# Patient Record
Sex: Female | Born: 1975 | Race: White | Hispanic: No | Marital: Married | Smoking: Never smoker
Health system: Southern US, Community
[De-identification: ages and names within clinical notes are randomized; demographics above are authoritative.]

---

## 2014-09-30 ENCOUNTER — Ambulatory Visit (INDEPENDENT_AMBULATORY_CARE_PROVIDER_SITE_OTHER): Payer: Managed Care, Other (non HMO) | Admitting: Family Medicine

## 2014-09-30 ENCOUNTER — Ambulatory Visit (INDEPENDENT_AMBULATORY_CARE_PROVIDER_SITE_OTHER): Payer: Managed Care, Other (non HMO)

## 2014-09-30 VITALS — BP 96/68 | HR 68 | Temp 97.4°F | Resp 17 | Ht 60.0 in | Wt 98.0 lb

## 2014-09-30 DIAGNOSIS — M25572 Pain in left ankle and joints of left foot: Secondary | ICD-10-CM

## 2014-09-30 DIAGNOSIS — S82892A Other fracture of left lower leg, initial encounter for closed fracture: Secondary | ICD-10-CM | POA: Diagnosis not present

## 2014-09-30 DIAGNOSIS — T148XXA Other injury of unspecified body region, initial encounter: Secondary | ICD-10-CM

## 2014-09-30 MED ORDER — HYDROCODONE-ACETAMINOPHEN 5-325 MG PO TABS: 1.0000 | ORAL_TABLET | Freq: Four times a day (QID) | ORAL | Status: AC | PRN

## 2014-09-30 MED ORDER — IBUPROFEN 600 MG PO TABS: 600.0000 mg | ORAL_TABLET | Freq: Three times a day (TID) | ORAL | Status: AC | PRN

## 2014-09-30 MED ORDER — CYCLOBENZAPRINE HCL 5 MG PO TABS: 5.0000 mg | ORAL_TABLET | Freq: Three times a day (TID) | ORAL | Status: AC | PRN

## 2014-09-30 NOTE — Progress Notes (Signed)
s     Chief Complaint:  Chief Complaint  Patient presents with  . Ankle Injury    left side     HPI: Melissa Ponce is a 39 y.o. female who is here for  left ankle and foot pain after running and tripped over a piece of asphalt on the road. She has some numbness of the left toe. She has no history of stress fractures, ankle sprains or foot fractures. She has iced it. It is painful to bear any weight on it. She has severe pain. It is sharp.  No past medical history on file. No past surgical history on file. History   Social History  . Marital Status: Married    Spouse Name: N/A  . Number of Children: N/A  . Years of Education: N/A   Social History Main Topics  . Smoking status: Never Smoker   . Smokeless tobacco: Not on file  . Alcohol Use: Not on file  . Drug Use: Not on file  . Sexual Activity: Not on file   Other Topics Concern  . None   Social History Narrative  . None   No family history on file. No Known Allergies Prior to Admission medications   Not on File     ROS: The patient denies fevers, chills, night sweats, unintentional weight loss, chest pain, palpitations, wheezing, dyspnea on exertion, nausea, vomiting, abdominal pain, dysuria, hematuria, melena, , weakness, or tingling.  All other systems have been reviewed and were otherwise negative with the exception of those mentioned in the HPI and as above.    PHYSICAL EXAM: Filed Vitals:   09/30/14 1046  BP: 96/68  Pulse: 68  Temp: 97.4 F (36.3 C)  Resp: 17   Filed Vitals:   09/30/14 1046  Height: 5' (1.524 m)  Weight: 98 lb (44.453 kg)   Body mass index is 19.14 kg/(m^2).   General: Alert, tearful, moderate acute distress HEENT:  Normocephalic, atraumatic, oropharynx patent. EOMI, PERRLA Cardiovascular:  Regular rate and rhythm, no rubs murmurs or gallops.  No Carotid bruits, radial pulse intact. No pedal edema.  Respiratory: Clear to auscultation bilaterally.  No wheezes, rales,  or rhonchi.  No cyanosis, no use of accessory musculature GI: No organomegaly, abdomen is soft and non-tender, positive bowel sounds.  No masses. Skin: No rashes. Neurologic: Facial musculature symmetric. Psychiatric: Patient is appropriate throughout our interaction. Lymphatic: No cervical lymphadenopathy Musculoskeletal: Gait intact.   LABS: No results found for this or any previous visit.   EKG/XRAY:   Primary read [facility]. Distal minimally displaced tibia fracture vs less likely artifact   ASSESSMENT/PLAN: Encounter Diagnoses  Name Primary?  . Pain in joint, ankle and foot, left Yes  . Sprain and strain   . Ankle fracture, left     IMPRESSION: 1. Possible nondisplaced fracture at the posterolateral distal tibia seen only on the oblique view. Correlate for point tenderness which would corroborate a fracture. 2. No other acute fracture or dislocation identified about the left ankle.   Electronically Signed  By: Odessa FlemingH Hall M.D.  On: 09/30/2014 13:11  Patient was given a cam boot and crutches. She is also given her x-rays and official x-ray report was given to her, she is from [place] so will get rechecked in [place] with an orthopedist [place]. Prescriptions for hydrocodone, Flexeril, prescription strength ibuprofen were given Follow up when necessary Gross sideeffects, risk and benefits, and alternatives of medications  d/w patient. Patient is aware that all medications have potential sideeffects and we are unable to predict every sideeffect or drug-drug interaction that may occur.  Lillyth Spong PHUONG, DO 09/30/2014 1:04 PM

## 2016-02-25 IMAGING — CR DG FOOT 2V*L*
3 series · 3 of 3 positions shown · non-contrast
Comparison: Left ankle series from today reported separately.

CLINICAL DATA: 39-year-old female with twisting injury while
running. Foot and ankle pain. Initial encounter.

EXAM:
LEFT FOOT - 2 VIEW

[AP]
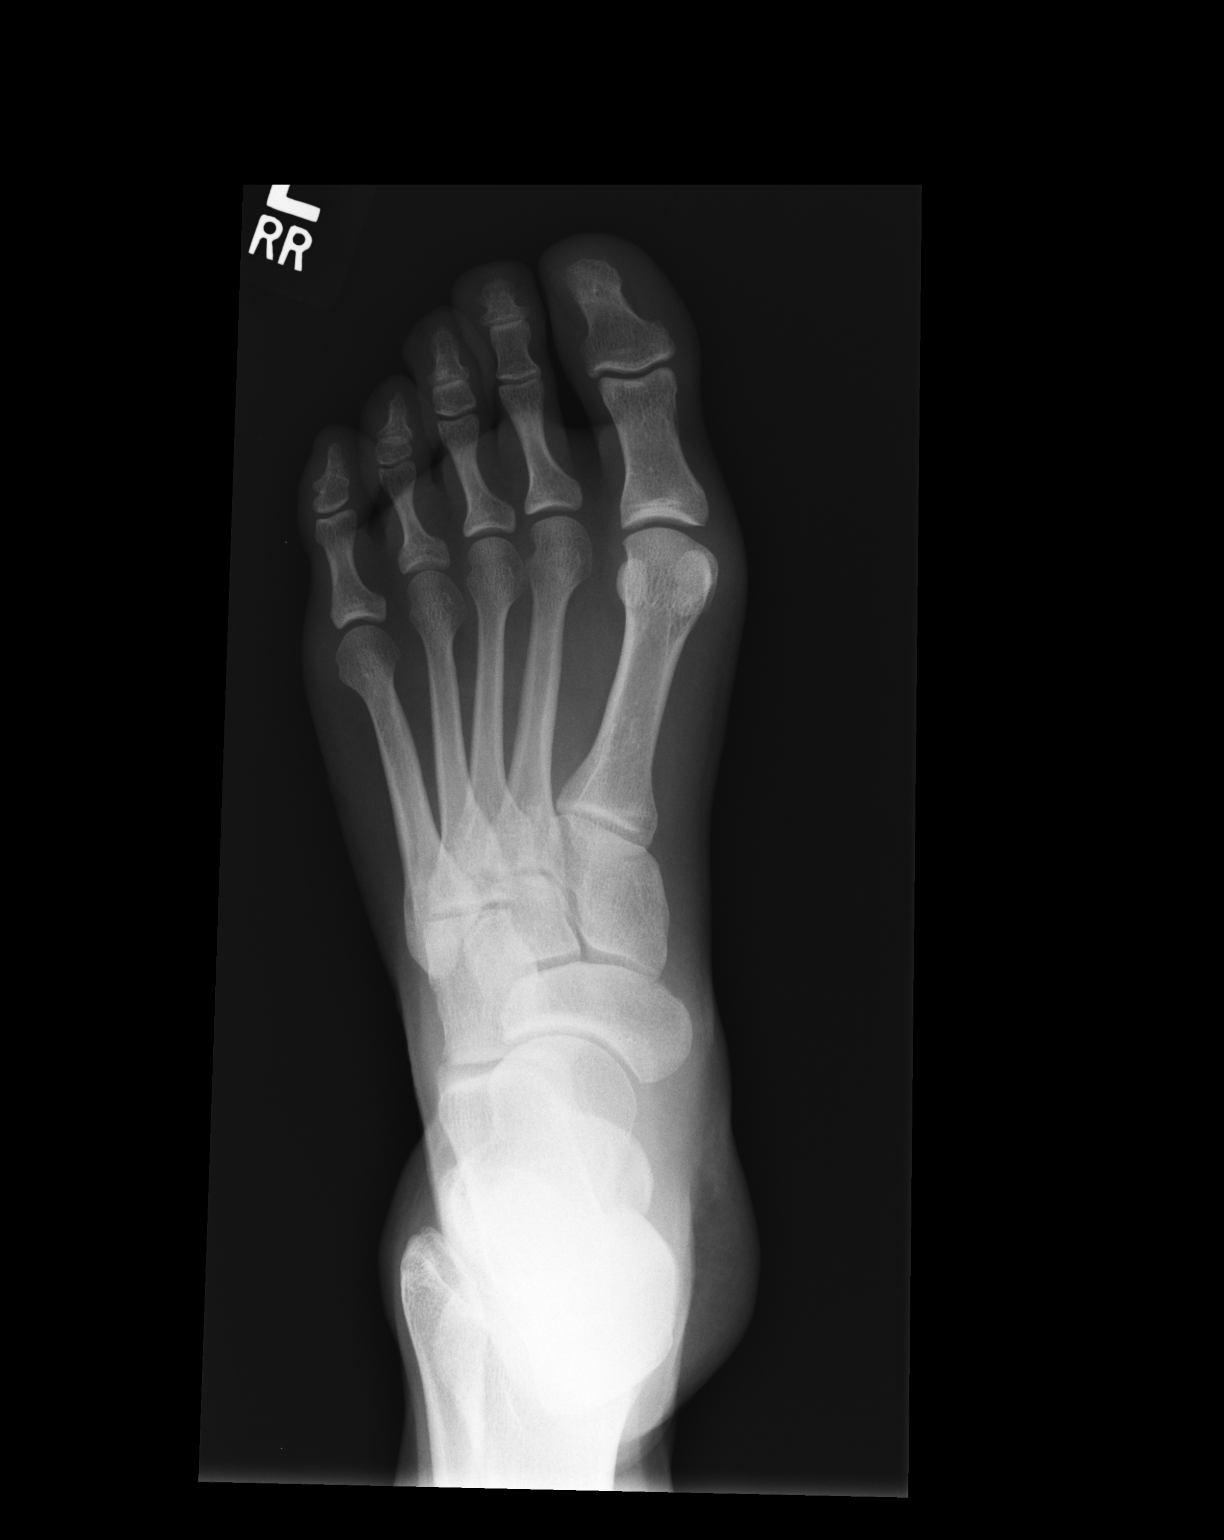

[ap obl int rot]
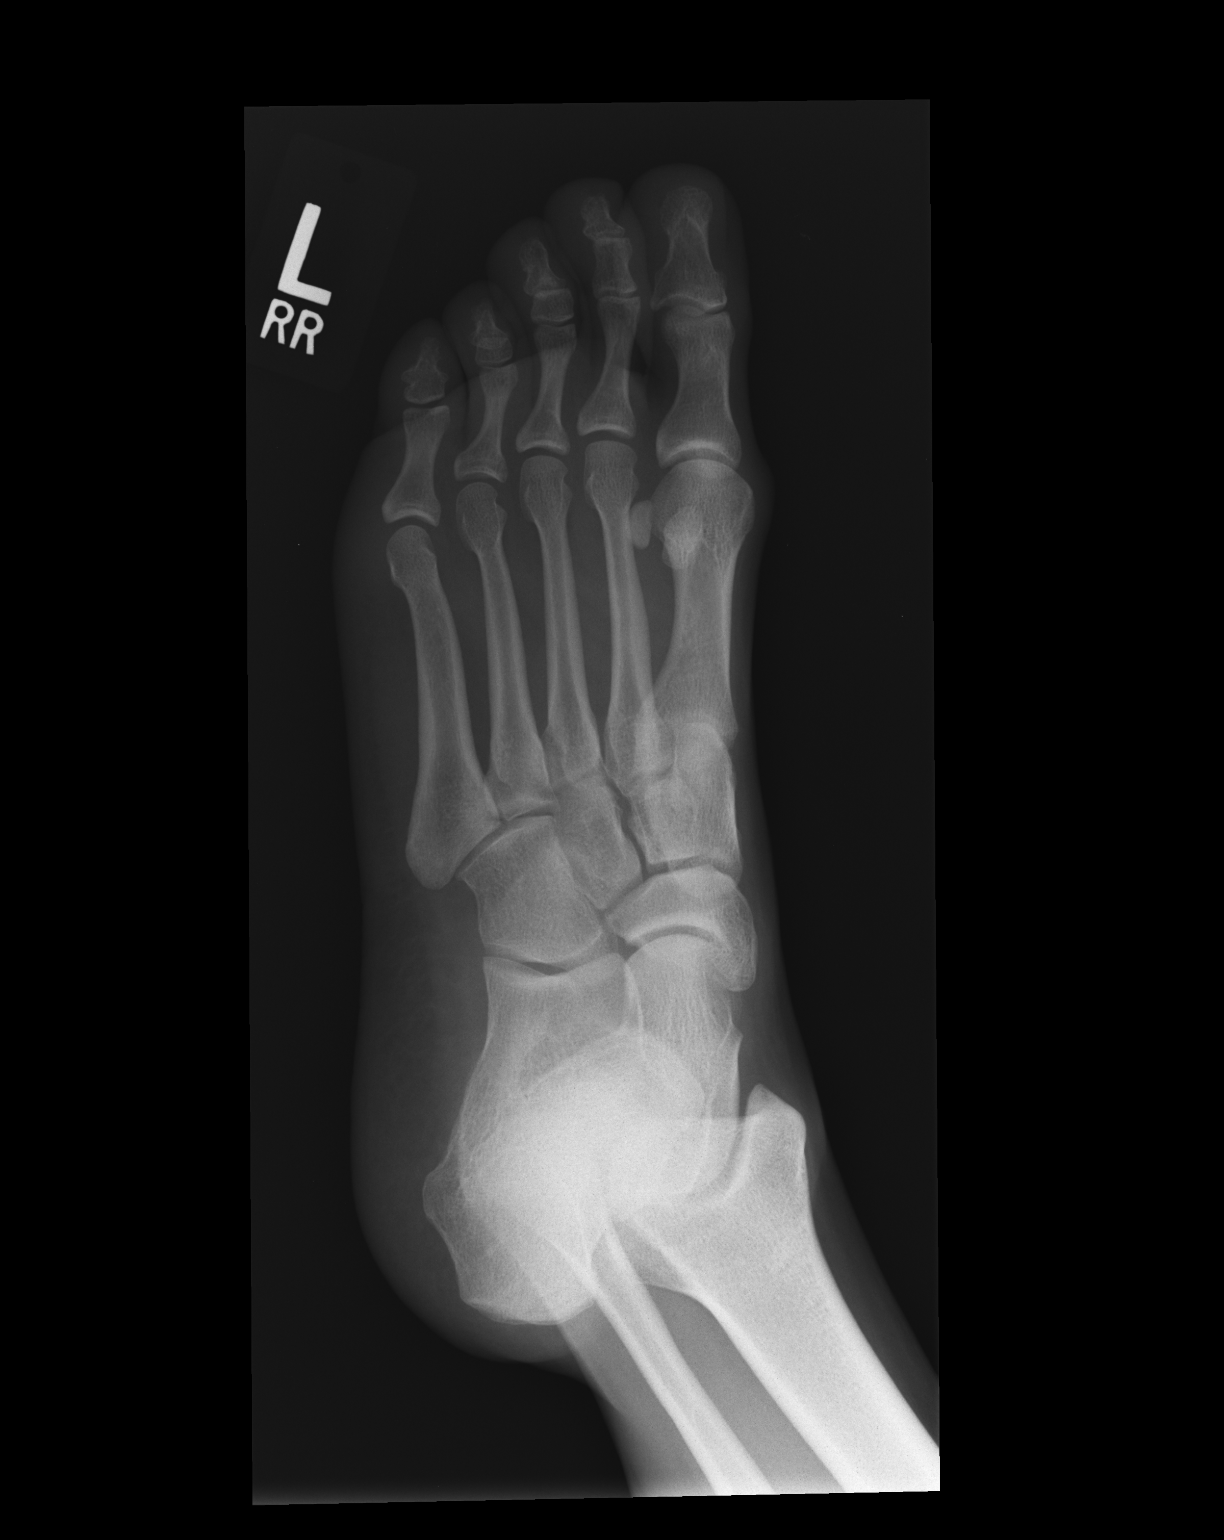

[lateral]
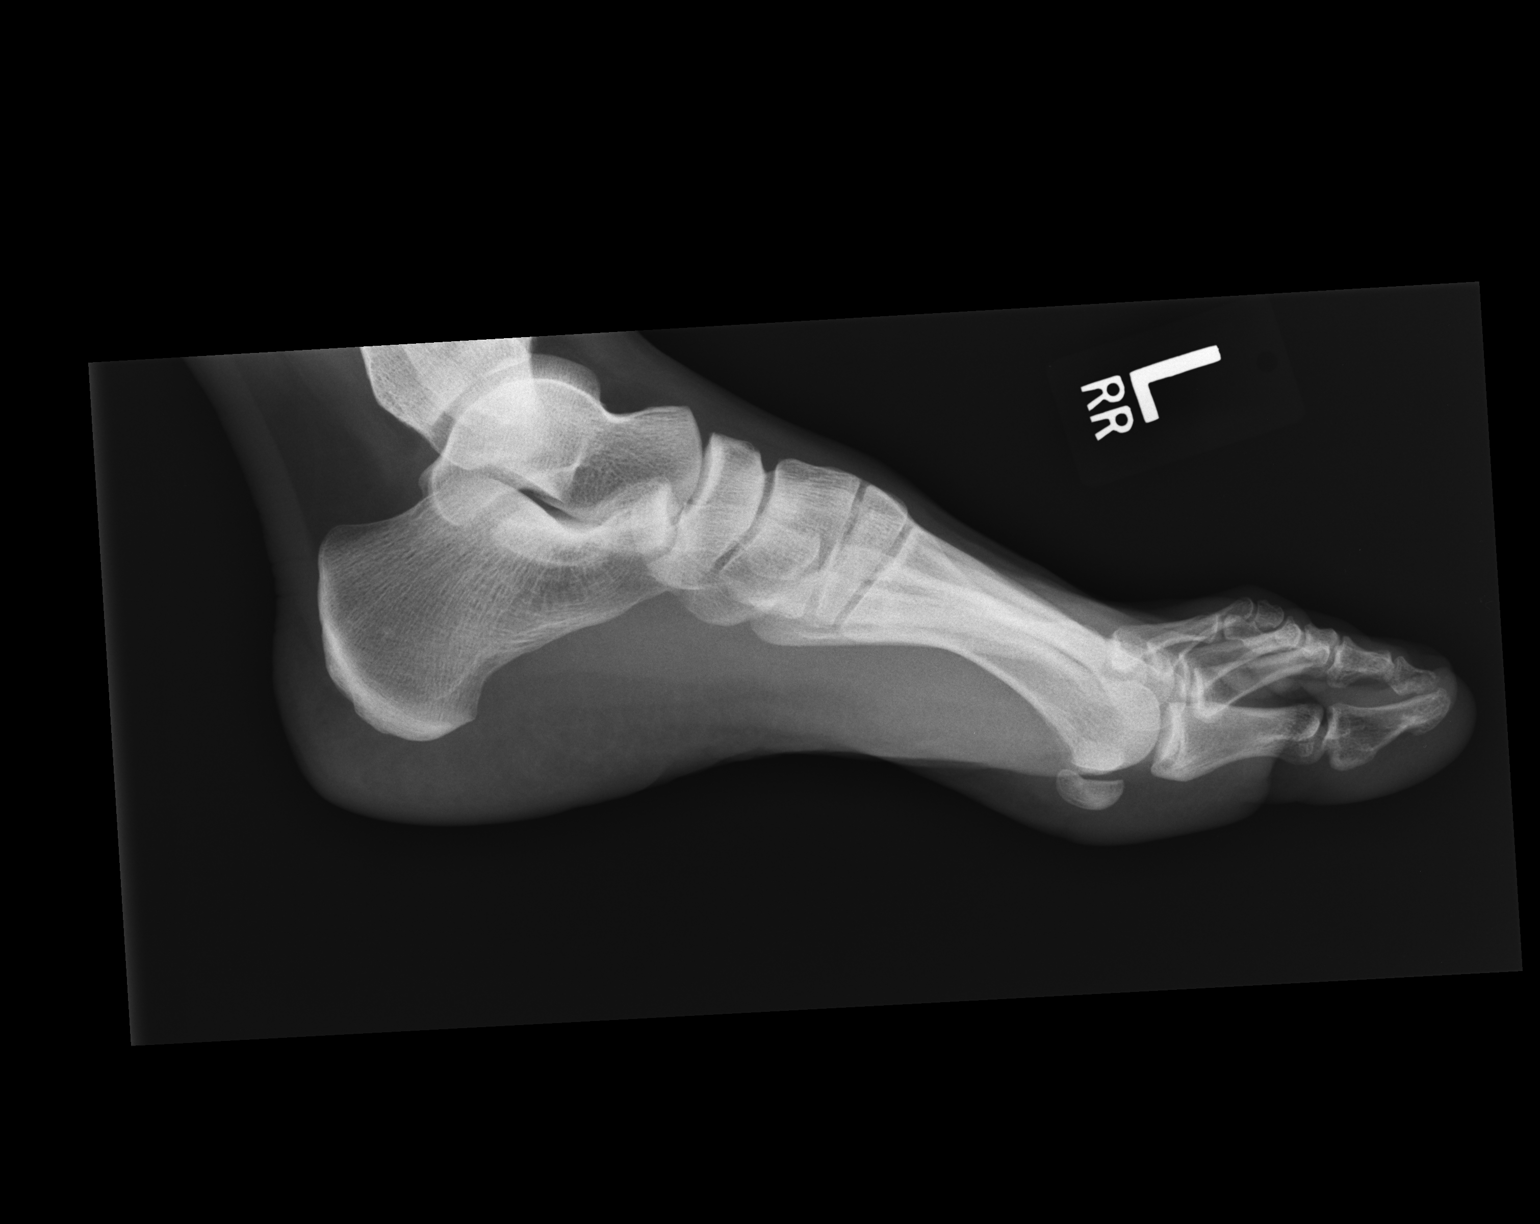

[3 of 3 positions shown; findings below may reference images not displayed]

FINDINGS: Bone mineralization is within normal limits. Calcaneus intact. Joint
spaces and alignment in the left foot are within normal limits. No
acute fracture or dislocation.
IMPRESSION: 1.  No acute fracture or dislocation identified about the left foot.
2. Left ankle series from today reported separately.
